# Patient Record
Sex: Female | Born: 1955 | Race: Black or African American | Hispanic: No | Marital: Married
Health system: Southern US, Community
[De-identification: ages and names within clinical notes are randomized; demographics above are authoritative.]

---

## 2014-05-24 ENCOUNTER — Inpatient Hospital Stay: Payer: Self-pay | Admitting: Surgery

## 2014-05-24 LAB — URINALYSIS, COMPLETE
BILIRUBIN, UR: NEGATIVE
Bacteria: NONE SEEN
Blood: NEGATIVE
Glucose,UR: NEGATIVE mg/dL (ref 0–75)
KETONE: NEGATIVE
Leukocyte Esterase: NEGATIVE
NITRITE: NEGATIVE
Ph: 8 (ref 4.5–8.0)
Protein: NEGATIVE
RBC,UR: 1 /HPF (ref 0–5)
Specific Gravity: 1.023 (ref 1.003–1.030)
Squamous Epithelial: NONE SEEN
WBC UR: 9 /HPF (ref 0–5)

## 2014-05-24 LAB — CBC WITH DIFFERENTIAL/PLATELET
Basophil #: 0.1 10*3/uL (ref 0.0–0.1)
Basophil %: 0.6 %
EOS PCT: 0.5 %
Eosinophil #: 0.1 10*3/uL (ref 0.0–0.7)
HCT: 47.3 % — AB (ref 35.0–47.0)
HGB: 15.6 g/dL (ref 12.0–16.0)
LYMPHS ABS: 2.3 10*3/uL (ref 1.0–3.6)
LYMPHS PCT: 18.1 %
MCH: 31.1 pg (ref 26.0–34.0)
MCHC: 33.1 g/dL (ref 32.0–36.0)
MCV: 94 fL (ref 80–100)
MONO ABS: 0.9 x10 3/mm (ref 0.2–0.9)
Monocyte %: 7.1 %
Neutrophil #: 9.5 10*3/uL — ABNORMAL HIGH (ref 1.4–6.5)
Neutrophil %: 73.7 %
Platelet: 269 10*3/uL (ref 150–440)
RBC: 5.04 10*6/uL (ref 3.80–5.20)
RDW: 12.8 % (ref 11.5–14.5)
WBC: 12.9 10*3/uL — ABNORMAL HIGH (ref 3.6–11.0)

## 2014-05-24 LAB — COMPREHENSIVE METABOLIC PANEL
ALK PHOS: 106 U/L
ANION GAP: 8 (ref 7–16)
AST: 143 U/L — AB (ref 15–37)
Albumin: 4.3 g/dL (ref 3.4–5.0)
BUN: 21 mg/dL — ABNORMAL HIGH (ref 7–18)
Bilirubin,Total: 1.1 mg/dL — ABNORMAL HIGH (ref 0.2–1.0)
CHLORIDE: 104 mmol/L (ref 98–107)
CO2: 29 mmol/L (ref 21–32)
CREATININE: 0.8 mg/dL (ref 0.60–1.30)
Calcium, Total: 10 mg/dL (ref 8.5–10.1)
Glucose: 122 mg/dL — ABNORMAL HIGH (ref 65–99)
Osmolality: 286 (ref 275–301)
POTASSIUM: 3.5 mmol/L (ref 3.5–5.1)
SGPT (ALT): 106 U/L — ABNORMAL HIGH (ref 12–78)
Sodium: 141 mmol/L (ref 136–145)
Total Protein: 8 g/dL (ref 6.4–8.2)

## 2014-05-24 LAB — LIPASE, BLOOD: Lipase: 273 U/L (ref 73–393)

## 2014-05-25 LAB — COMPREHENSIVE METABOLIC PANEL
ANION GAP: 6 — AB (ref 7–16)
Albumin: 3.5 g/dL (ref 3.4–5.0)
Alkaline Phosphatase: 145 U/L — ABNORMAL HIGH
BILIRUBIN TOTAL: 1.1 mg/dL — AB (ref 0.2–1.0)
BUN: 11 mg/dL (ref 7–18)
CALCIUM: 8.9 mg/dL (ref 8.5–10.1)
Chloride: 109 mmol/L — ABNORMAL HIGH (ref 98–107)
Co2: 27 mmol/L (ref 21–32)
Creatinine: 0.94 mg/dL (ref 0.60–1.30)
EGFR (African American): >60
EGFR (Non-African Amer.): >60
GLUCOSE: 92 mg/dL (ref 65–99)
Osmolality: 282 (ref 275–301)
Potassium: 3.9 mmol/L (ref 3.5–5.1)
SGOT(AST): 510 U/L — ABNORMAL HIGH (ref 15–37)
SGPT (ALT): 587 U/L — ABNORMAL HIGH (ref 12–78)
SODIUM: 142 mmol/L (ref 136–145)
Total Protein: 6.8 g/dL (ref 6.4–8.2)

## 2014-05-25 LAB — CBC WITH DIFFERENTIAL/PLATELET
BASOS ABS: 0 10*3/uL (ref 0.0–0.1)
Basophil %: 0.5 %
EOS ABS: 0.1 10*3/uL (ref 0.0–0.7)
EOS PCT: 0.9 %
HCT: 41.5 % (ref 35.0–47.0)
HGB: 13.8 g/dL (ref 12.0–16.0)
LYMPHS ABS: 1.5 10*3/uL (ref 1.0–3.6)
Lymphocyte %: 19.8 %
MCH: 31.3 pg (ref 26.0–34.0)
MCHC: 33.2 g/dL (ref 32.0–36.0)
MCV: 94 fL (ref 80–100)
Monocyte #: 0.7 x10 3/mm (ref 0.2–0.9)
Monocyte %: 9.4 %
Neutrophil #: 5.2 10*3/uL (ref 1.4–6.5)
Neutrophil %: 69.4 %
Platelet: 263 10*3/uL (ref 150–440)
RBC: 4.41 10*6/uL (ref 3.80–5.20)
RDW: 12.8 % (ref 11.5–14.5)
WBC: 7.5 10*3/uL (ref 3.6–11.0)

## 2014-05-25 LAB — SEDIMENTATION RATE: Erythrocyte Sed Rate: 9 mm/hr (ref 0–30)

## 2014-05-26 LAB — COMPREHENSIVE METABOLIC PANEL
ALK PHOS: 116 U/L
ALT: 316 U/L — AB (ref 12–78)
ANION GAP: 3 — AB (ref 7–16)
Albumin: 3.1 g/dL — ABNORMAL LOW (ref 3.4–5.0)
BUN: 14 mg/dL (ref 7–18)
Bilirubin,Total: 0.5 mg/dL (ref 0.2–1.0)
CREATININE: 1.05 mg/dL (ref 0.60–1.30)
Calcium, Total: 8.7 mg/dL (ref 8.5–10.1)
Chloride: 109 mmol/L — ABNORMAL HIGH (ref 98–107)
Co2: 29 mmol/L (ref 21–32)
EGFR (African American): >60
GFR CALC NON AF AMER: 58 — AB
Glucose: 84 mg/dL (ref 65–99)
Osmolality: 281 (ref 275–301)
Potassium: 3.8 mmol/L (ref 3.5–5.1)
SGOT(AST): 127 U/L — ABNORMAL HIGH (ref 15–37)
Sodium: 141 mmol/L (ref 136–145)
Total Protein: 6 g/dL — ABNORMAL LOW (ref 6.4–8.2)

## 2014-05-26 LAB — LIPASE, BLOOD: Lipase: 206 U/L (ref 73–393)

## 2014-05-27 LAB — COMPREHENSIVE METABOLIC PANEL
ALT: 216 U/L — AB (ref 12–78)
AST: 54 U/L — AB (ref 15–37)
Albumin: 3.1 g/dL — ABNORMAL LOW (ref 3.4–5.0)
Alkaline Phosphatase: 111 U/L
Anion Gap: 5 — ABNORMAL LOW (ref 7–16)
BILIRUBIN TOTAL: 0.4 mg/dL (ref 0.2–1.0)
BUN: 14 mg/dL (ref 7–18)
CREATININE: 0.98 mg/dL (ref 0.60–1.30)
Calcium, Total: 8.9 mg/dL (ref 8.5–10.1)
Chloride: 110 mmol/L — ABNORMAL HIGH (ref 98–107)
Co2: 28 mmol/L (ref 21–32)
EGFR (Non-African Amer.): >60
Glucose: 108 mg/dL — ABNORMAL HIGH (ref 65–99)
OSMOLALITY: 286 (ref 275–301)
Potassium: 3.7 mmol/L (ref 3.5–5.1)
Sodium: 143 mmol/L (ref 136–145)
Total Protein: 6.1 g/dL — ABNORMAL LOW (ref 6.4–8.2)

## 2014-05-28 LAB — COMPREHENSIVE METABOLIC PANEL
Albumin: 2.9 g/dL — ABNORMAL LOW (ref 3.4–5.0)
Alkaline Phosphatase: 154 U/L — ABNORMAL HIGH
Anion Gap: 6 — ABNORMAL LOW (ref 7–16)
BUN: 9 mg/dL (ref 7–18)
Bilirubin,Total: 0.6 mg/dL (ref 0.2–1.0)
CREATININE: 1.05 mg/dL (ref 0.60–1.30)
Calcium, Total: 8.4 mg/dL — ABNORMAL LOW (ref 8.5–10.1)
Chloride: 108 mmol/L — ABNORMAL HIGH (ref 98–107)
Co2: 28 mmol/L (ref 21–32)
EGFR (African American): >60
GFR CALC NON AF AMER: 58 — AB
GLUCOSE: 110 mg/dL — AB (ref 65–99)
Osmolality: 282 (ref 275–301)
Potassium: 3.4 mmol/L — ABNORMAL LOW (ref 3.5–5.1)
SGOT(AST): 153 U/L — ABNORMAL HIGH (ref 15–37)
SGPT (ALT): 312 U/L — ABNORMAL HIGH (ref 12–78)
SODIUM: 142 mmol/L (ref 136–145)
TOTAL PROTEIN: 6.1 g/dL — AB (ref 6.4–8.2)

## 2014-05-28 LAB — PLATELET COUNT: PLATELETS: 234 10*3/uL (ref 150–440)

## 2014-07-02 LAB — PATHOLOGY REPORT

## 2015-04-16 NOTE — Consult Note (Signed)
Pt with CT this evening showing thickening and edema of the gall bladder wall, likely cholecystitis with adjacent inflammation of liver?  Will get HIDA scan in morning.  Electronic Signatures: Scot JunElliott, Robert T (MD)  (Signed on 02-Jun-15 19:14)  Authored  Last Updated: 02-Jun-15 19:14 by Scot JunElliott, Robert T (MD)

## 2015-04-16 NOTE — Consult Note (Signed)
Brief Consult Note: Diagnosis: RUQ pain gallstones, elevated lft's.   Patient was seen by consultant.   Consult note dictated.   Comments: Obtain CT of abd/pelvis. Inflammatory markers. Await ANA, Hep viral labs. Patient reports hx of receiving Twin Rx.  Unusual to have a blocked bile duct with only a slightly elevated total  bili and such high LFT's. New med Metformin: NO signif liver SE with this drug, in fact safe to use w cirrhosis. Obtain old records from gi w/[facility]. Further gi rec pending findings..  Electronic Signatures: Rowan BlaseMills, Cem Kosman Ann (NP)  (Signed 02-Jun-15 15:19)  Authored: Brief Consult Note   Last Updated: 02-Jun-15 15:19 by Rowan BlaseMills, Rosario Duey Ann (NP)

## 2015-04-16 NOTE — Op Note (Signed)
PATIENT NAME:  Brandi Salinas, [name]:  [id number] DATE OF BIRTH:  11/08/1956  DATE OF PROCEDURE:  05/27/2014  PREOPERATIVE DIAGNOSIS: Symptomatic cholelithiasis, possible choledocholithiasis.   POSTOPERATIVE DIAGNOSIS:  Cholelithiasis and choledocholithiasis.   PROCEDURE PERFORMED:   1.  Laparoscopic cholecystectomy with cholangiogram.  2.  Laparoscopic liver biopsy.   ESTIMATED BLOOD LOSS: 20 mL.   COMPLICATIONS: None.   SPECIMENS: Gallbladder and liver biopsy.   INTRAOPERATIVE FINDINGS: Cholangiogram which showed no contrast into duodenum.   INDICATION FOR SURGERY: [name] is a pleasant 59 year old female who presented initially with signs concerning for cholecystitis; however, she later developed hepatitis-type picture which resolved because of history of previous pain with intermittent right upper quadrant pain. She was brought to the operating room for laparoscopic cholecystectomy.   DETAILS OF PROCEDURE:  As follows:  Informed consent was obtained. [name] was brought to the operating room suite. She was induced. Endotracheal tube was placed. General anesthesia was administered. Her abdomen was prepped and draped in standard surgical fashion. A timeout was then performed correctly identifying the patient name, operative site and procedure to be performed. A supraumbilical incision was made. It was deepened down to the fascia. The fascia was incised. The peritoneum was entered. Two stay sutures were placed through the fasciotomy. An 11 mm epigastric and 2 right subcostal trocars were placed at the midclavicular and anterior axillary line. The gallbladder was then retracted over the dome of the liver. The cystic artery and cystic duct were dissected out. A critical view was obtained. The duct was clipped once and opened. A cholangiogram was then performed, which showed good filling of the biliary tree, but no flow into the duodenum. I did give 1 mg of glucagon and then proceeded to power  flush with normal saline; however, I could not get contrast to go to the duodenum on a repeat cholangiogram. The decision was made to have patient undergo postoperative ERCP. The laparoscopic stapler was placed across the duct in anticipation of postop ERCP. The artery was then clipped and ligated. The gallbladder was then taken off the gallbladder fossa and brought out through an Endo Catch bag. Hemostasis was obtained.  Next, I used laparoscopic uterine scissors to obtain a small specimen for pathology, as the patient did develop brief episode of hepatitis while in the hospital. A 10 JamaicaFrench JP was then placed through the lattermost port to protect against biliary leak after ERCP and sutured in place with a 3-0 nylon. The gallbladder fossa was then examined again and made hemostatic. Following this, the abdomen was desufflated. The trocars were removed under direct visualization. The supraumbilical fascia was closed with figure-of-eight 0 Vicryl. All skin sites were then closed with interrupted 4-0 Monocryl deep dermals. Dermabond was then placed over the wound, and the patient was then awoken, extubated and brought to the postanesthesia care unit. There were no immediate complications. Needle, sponge and instrument counts were correct at the end of the procedure.      ____________________________ Si Raiderhristopher A. Pam Vanalstine, MD cal:dmm D: 05/28/2014 09:19:02 ET T: 05/28/2014 09:40:03 ET JOB#: [id number]  cc: Cristal Deerhristopher A. Amalia Edgecombe, MD, <Dictator> Jarvis NewcomerHRISTOPHER A Chandler Stofer MD ELECTRONICALLY SIGNED 05/29/2014 13:30

## 2015-04-16 NOTE — Consult Note (Signed)
Chief Complaint:  Subjective/Chief Complaint Dr Rexene Edison contacted me to set up ERCP with Dr Allen Norris tomorrow.  Brandi Salinas has mild abdominal incisional pain currently 2/10.  Denies nausea or vomiting.   VITAL SIGNS/ANCILLARY NOTES: **Vital Signs.:   04-Jun-15 11:04  Vital Signs Type Post-Op  Temperature Temperature (F) 97.8  Celsius 36.5  Pulse Pulse 60  Respirations Respirations 16  Systolic BP Systolic BP 712  Diastolic BP (mmHg) Diastolic BP (mmHg) 68  Mean BP 82  Pulse Ox % Pulse Ox % 95  Pulse Ox Activity Level  At rest  Oxygen Delivery Room Air/ 21 %   Brief Assessment:  GEN well developed, well nourished, no acute distress, A/Ox3, but sleepy   Respiratory normal resp effort   Gastrointestinal details normal Soft  Nondistended  +faint BSx4, JP with scant serous drainage   EXTR negative cyanosis/clubbing, Trace pretibial edema bilat   Additional Physical Exam Skin: warm, dry, intact   Lab Results:  Hepatic:  04-Jun-15 04:18   Bilirubin, Total 0.4  Alkaline Phosphatase 111 (45-117 NOTE: New Reference Range 11/13/13)  SGPT (ALT)  216  SGOT (AST)  54  Total Protein, Serum  6.1  Albumin, Serum  3.1  Routine Chem:  04-Jun-15 04:18   Glucose, Serum  108  BUN 14  Creatinine (comp) 0.98  Sodium, Serum 143  Potassium, Serum 3.7  Chloride, Serum  110  CO2, Serum 28  Calcium (Total), Serum 8.9  Osmolality (calc) 286  eGFR (African American) >60  eGFR (Non-African American) >60 (eGFR values <5m/min/1.73 m2 may be an indication of chronic kidney disease (CKD). Calculated eGFR is useful in patients with stable renal function. The eGFR calculation will not be reliable in acutely ill patients when serum creatinine is changing rapidly. It is not useful in  patients on dialysis. The eGFR calculation may not be applicable to patients at the low and high extremes of body sizes, pregnant women, and vegetarians.)  Anion Gap  5   Radiology Results: XRay:    04-Jun-15 08:37,  Cholangiogram Operative  Cholangiogram Operative   REASON FOR EXAM:    Cholelithiasis  COMMENTS:       PROCEDURE: DXR - DXR CHOLANGIOGRAM OP (INITIAL)  - May 27 2014  8:37AM     CLINICAL DATA:  Cholecystectomy for symptomatic cholelithiasis.    EXAM:  INTRAOPERATIVE CHOLANGIOGRAM    TECHNIQUE:  Cholangiographic images from the C-arm fluoroscopic device were  submitted for interpretation post-operatively. Please see the  procedural report for the amount of contrast and the fluoroscopy  time utilized.  COMPARISON:  Abdominal ultrasound on 05/24/2014, CT of the abdomen  on 05/25/2014.    FINDINGS:  Intraoperative image shows obtained with a C-arm demonstrates a  normal caliber opacified biliary tree. There are no visualized  filling defects. On the submitted images, contrast is not identified  in the duodenum and the distal common bile duct is not completely  evaluated. Recommend correlation with real-time appearance  intraoperatively at the time of contrast injection.     IMPRESSION:  Nonvisualization of the duodenum.    Electronically Signed    By: GAletta EdouardM.D.    On: 05/27/2014 08:42         Verified By: GAzzie Roup M.D.,   Assessment/Plan:  Assessment/Plan:  Assessment Choledocholithiasis on IOC:  Reviewed records from surgery & Dr EVira Agar  Explained ERCP with sphincterotomy/stone extraction with Dr WAllen Norristomorrow to patient & sister.  Discussed risks/benefits of procedure which include but are not limited  to pancreatitis, bleeding, infection, perforation & drug reaction.  Patient agrees with this plan & consent will be obtained.   Plan 1) NPO after MN 2) ERCP tomorrow 3) continue supportive measures 4) Brandi Salinas already on Invanz for antibiotic coverage Please call if you have any questions or concerns   Electronic Signatures: Andria Meuse (NP)  (Signed 04-Jun-15 13:45)  Authored: Chief Complaint, VITAL SIGNS/ANCILLARY NOTES, Brief Assessment, Lab  Results, Radiology Results, Assessment/Plan   Last Updated: 04-Jun-15 13:45 by Andria Meuse (NP)

## 2015-04-16 NOTE — Consult Note (Signed)
Pt feeling better, her LFT's are down significantly, HIDA scan shows no evidence of obst.  Agree with plans for GB removal tomorrow.  Patient in good spirits, would like a copy of discharge summary to go to her primary doctor.  CRP up some, Hep ABC shows only old Hep A which is very common.   Electronic Signatures: Scot JunElliott, Odus Clasby T (MD)  (Signed on 03-Jun-15 14:39)  Authored  Last Updated: 03-Jun-15 14:39 by Scot JunElliott, Shatera Rennert T (MD)

## 2015-04-16 NOTE — Consult Note (Signed)
PATIENT NAME:  Brandi Salinas, Brandi Salinas MR#:  [id number] DATE OF BIRTH:  [birth year]  DATE OF CONSULTATION:  05/25/2014  REFERRING PHYSICIAN:  Cristal Deer A. Lundquist, MD CONSULTING PHYSICIAN:  Scot Jun, MD/Artur Winningham A. Arvilla Market, ANP (Adult Nurse Practitioner)  REASON FOR CONSULTATION: Elevated liver enzymes.   HISTORY OF PRESENT ILLNESS: This 59 year old patient has a history of recently diagnosed diabetes mellitus with new-start metformin and allergic rhinitis with as-needed Claritin, presents to the Emergency Room with severe epigastric, right upper quadrant pain of 1-day duration. The patient reports on Sunday she had abdominal pain associated after eating egg white, grapes and oatmeal fiber bar, which is her basic breakfast. She was on her way to work, developed severe pain associated with nausea, had a couple episodes of vomiting. This pain became quite severe in the epigastric right upper quadrant area, radiating into the back. She presented to the Emergency Room. Ultrasound showed gallstones and elevated liver enzymes. GI has been asked to see patient and her. She has been seen by Dr. Juliann Pulse from surgery and decision is to be made whether her liver findings are related to the gallbladder abnormality or something new.   The patient reports she has had problems with epigastric and right upper quadrant pain intermittent now for several years. She did have an upper endoscopy by Dr. Huston Foley at [facility] 09/2013 for this complaint with reported  normal findings. She reports she either had a barium swallow or a gastric emptying study. She was having problems with vomiting fully digested food at that time. The patient does not present on proton pump inhibitor. She denies history of gastritis, ulcers. With eating, she would often get sensation in the low midsternal area of pressure, some discomfort, no true food dysphagia. She denies history of ulcer. She is also up-to-date on colonoscopy for  personal history of colon polyps with her most recent study reported about 2 years ago. The patient reports she had an ultrasound last year as well as a CT and denies hearing about gallstones or abnormal liver studies. To her knowledge, she has never been diagnosed with elevated liver labs. She may have heard the term fatty liver, she is not certain. No risk factors or history of hepatitis. The patient did have a Twinrix vaccine about a year and a half ago in preparation for a trip to [place].   Currently, the patient is receiving narcotic pain medication, has remained n.p.o. and is feeling much better. She tried to eat a few crackers yesterday with severe nausea. The patient is feeling hungry and is requesting food when possible. Her LFTs markedly incresed overnight with stable total bilirubin.   PAST MEDICAL HISTORY: 1.  Diabetes mellitus.  2.  Allergic rhinitis.   PAST SURGICAL HISTORY: None listed.   MEDICATIONS:  1.  Metformin 500 mg daily.  2.  Claritin 24-hour tablet once daily.   HABITS: Negative tobacco or alcohol, past or present.   SOCIAL HISTORY: The patient is married, works in Community education officer. She denies history of illicit drug use or risk factors for hepatitis.   REVIEW OF SYSTEMS: Ten systems reviewed. Positive as noted in the history of present illness, otherwise negative. She feels cold all the time but has noted no fevers. Does have some chills. Abdominal pain as noted. Reports bowel habits are normal without blood or melena. She denies exertional chest pain, shortness of breath or dyspnea on exertion. No exertional epigastric pain. The patient denies NSAID use.   PHYSICAL EXAMINATION: VITAL SIGNS: Temperature 98.1,  75, 18, pressure 120/78, pulse oximetry room air is 96%.  GENERAL: Well-appearing, well-nourished female in no acute distress.  HEENT: Head is normocephalic. Conjunctivae pink. Sclerae anicteric. Oral mucosa is moist and intact.  NECK: Supple. Trachea is midline. No  lymphadenopathy.  CARDIAC: S1, S2 without murmur or gallop.  LUNGS: CTA. Respirations are eupneic.  ABDOMEN: Soft, positive bowel sounds, mild epigastric tenderness, more tenderness right upper quadrant. Positive Murphy's sign. Slight right mid to lower discomfort with palpation.  RECTAL: Deferred.  SKIN: Warm and dry without rash or edema.  NEUROLOGIC: Cranial nerves II through XII grossly intact. Good memory.  PSYCHIATRIC: Affect and mood within normal. Appears comfortable and relaxed.   LABORATORY, DIAGNOSTIC, AND RADIOLOGICAL DATA:  1.  Admission laboratory studies 05/24/2014, BUN 21, creatinine 0.80, glucose 122, lipase 273, albumin is 4.3, total bilirubin 1.1, alkaline phosphatase 106, AST 143, ALT 106. WBC 12.9, hemoglobin 15.6, platelets 269. EKG shows sinus bradycardia, heart rate 46, no ST abnormality, no acute changes.  2.  Followup laboratory studies 05/25/2014 with total bilirubin 1.1, alkaline phosphatase 145, AST 510, ALT 587. WBC 7.5, hemoglobin 13.8.  3.  Radiology: Abdominal ultrasound performed 06/091/2015 for right upper quadrant pain, and this was a limited right upper quadrant ultrasound showing gallbladder with multiple mobile gallstones. No wall thickening or sonographic Murphy's sign noted. Common bile duct 3.2 mm. Liver showed no focal lesion, normal parenchyma.   IMPRESSION: This 59 year old patient with newly diagnosed diabetes and history of recurrent epigastric, right upper quadrant pain status post reported negative esophagogastroduodenoscopy  last year comes in with ultrasound showing gallstones, elevated liver LFTs, slight elevation in alkaline phosphatase and unremarkable total bilirubin. Laboratory studies have been consistent with cholecystitis. The patient is receiving IV antibiotics. She has had consultation with a Careers advisersurgeon. There is discussion about potential cholecystectomy after other possible etiologies have been worked up.   PLAN:  1.  Agree with ANA,  hepatitis viral panel, will await results. The patient reports she has had the Twinrix hepatitis A and B vaccine about a year and a half ago in anticipation for a trip to [place]. She denies risk factors for elevated liver labs. Up-to-date reviewed and metformin does not have a particular side effect profile for any liver abnormality. In fact, it is safe to use metformin with cirrhosis. Do not suspect drug interaction.  2.  Agree with plans for CT of the abdomen and pelvis per Dr. Earnest ConroyElliott's recommendation. Also will add sedimentation rate and CRP. Further GI recommendations pending study results.   Thank you for the consultation. These services provided by Amedeo KinsmanKimberly Lonnette Shrode, ANP, under collaborative agreement with Scot Junobert T. Elliott, MD.   ____________________________ Ranae PlumberKimberly A. Arvilla MarketMills, ANP (Adult Nurse Practitioner) kam:cs D: 05/25/2014 15:13:57 ET T: 05/25/2014 15:42:41 ET JOB#: [id number]  cc: Cala BradfordKimberly A. Arvilla MarketMills, ANP (Adult Nurse Practitioner), <Dictator> Ranae PlumberKimberly A. Suzette BattiestMills RN, MSN, ANP-BC Adult Nurse Practitioner ELECTRONICALLY SIGNED 05/25/2014 18:56

## 2015-04-16 NOTE — Discharge Summary (Signed)
PATIENT NAME:  Brandi Salinas, Brandi Salinas MR#:  [id number] DATE OF BIRTH:  10/20/1956  DATE OF ADMISSION:  05/24/2014 DATE OF DISCHARGE:  05/29/2014  DISCHARGE DIAGNOSES: 1.  Choledocholithiasis.  2.  Diabetes mellitus.  3.  History of Cesarean section.   PROCEDURE PERFORMED:  1.  Laparoscopic cholecystectomy with cholangiogram and laparoscopic liver biopsy 05/27/2014.  2.  ERCP with common duct stone extraction on 05/28/2014.    DISCHARGE MEDICATIONS:  Are as follows:   1.  Metformin 500 mg by mouth daily.  2.  Claritin 24 hour allergy 10 mg by mouth daily as needed.  3.  Norco 1 tab by mouth q. 4 hours as needed pain.   INDICATION FOR ADMISSION:  Brandi Salinas is a pleasant 59 year old female who presented with right upper quadrant epigastric pain, nausea and vomiting.  She has had recurrent pain in the past and had a mildly elevated white cell count and LFTs and was admitted for what was thought to be management of cholecystitis.   HOSPITAL COURSE:  Brandi Salinas was admitted with what was felt to be cholecystitis; however, on hospital day 2, her LFTs had increased greatly.  GI was counseled at that time and was thought that she may have had some sort of hepatitic event, however choledocholithiasis was still in the consideration.  These had improved and on hospital day 4, she underwent laparoscopic cholecystectomy with cholangiogram and liver biopsy to evaluate for possible hepatitis.  She was noted to have a common duct stone and contrast did not flow easily into the duodenum.  Therefore, on June 5th, she underwent ERCP with Dr. Servando SnareWohl and had a common duct stone extraction.  On June 6th she was given a regular diet, was taking good by mouth with good by mouth pain control, was voiding and stooling without difficulties.  At that time, she was discharged to home in satisfactory condition.   DISCHARGE INSTRUCTIONS:  Brandi Salinas is to follow up with me in approximately one week.  She is to call or return to the ED if  has increased pain, nausea, vomiting, redness, drainage from incision.     ____________________________ Si Raiderhristopher A. Lundquist, MD cal:ea D: 06/10/2014 21:22:48 ET T: 06/11/2014 06:04:19 ET JOB#: [id number]  cc: Cristal Deerhristopher A. Lundquist, MD, <Dictator> Jarvis NewcomerHRISTOPHER A LUNDQUIST MD ELECTRONICALLY SIGNED 06/15/2014 10:47

## 2015-04-16 NOTE — H&P (Signed)
   Subjective/Chief Complaint RUQ pain, acute onset and recurrent   History of Present Illness [name] is a pleasant 59 yo F with a history of recently diagnosed DM and prior workup for RUQ/epigastric pain who presents with 1 day of worsening RUQ pain and N/V.  She said that her pain began yesterday and improved then became more severe at 9 am.  + N/V, ? bloody.  Has had recurrent episodes of a similar pain before and has had EGD with negative workup except hiatal hernia.  + chills prior to vomiting.  Gets regular colonoscopies for FH of colon cancer   Past History DM H/o c section   Past Medical Health Diabetes Mellitus   Past Med/Surgical Hx:  Diabetes:   ALLERGIES:  No Known Allergies:   Family and Social History:  Family History Coronary Artery Disease  Hypertension  Diabetes Mellitus  Cancer  Colon cancer   Social History negative tobacco, negative ETOH   Place of Living Home   Review of Systems:  Subjective/Chief Complaint RUQ pain   Fever/Chills Yes   Cough No   Sputum No   Abdominal Pain Yes   Diarrhea No   Constipation No   Nausea/Vomiting Yes   SOB/DOE No   Chest Pain No   Dysuria No   Tolerating PT No   Tolerating Diet Nauseated  Vomiting   Medications/Allergies Reviewed Medications/Allergies reviewed   Physical Exam:  GEN well developed, well nourished, no acute distress   HEENT pink conjunctivae, PERRL   RESP normal resp effort  clear BS  no use of accessory muscles   CARD regular rate  no murmur  no thrills   ABD positive tenderness  no hernia  soft  normal BS  RUQ tender to palpation   EXTR negative cyanosis/clubbing, negative edema   SKIN normal to palpation, No rashes, No ulcers, skin turgor poor   NEURO cranial nerves intact, negative rigidity, negative tremor   PSYCH alert, A+O to time, place, person, good insight    Assessment/Admission Diagnosis [name] is a pleasant 59 yo diabetic female with a PMH of recurrent RUQ  pain.  U/S with gallstones, no gb wall thickening and no pericholecystic fluid.  + leukocytosis, + mildly elevated LFT.  Tender to palp.  Clinical and laboratory findings consistent with cholecystitis.   Plan Resuscitate, IV abx.  Plan for lap cholecystectomy tomorrow.   Electronic Signatures: Jarvis NewcomerLundquist, Dera Vanaken A (MD)  (Signed 01-Jun-15 15:59)  Authored: CHIEF COMPLAINT and HISTORY, PAST MEDICAL/SURGIAL HISTORY, ALLERGIES, FAMILY AND SOCIAL HISTORY, REVIEW OF SYSTEMS, PHYSICAL EXAM, ASSESSMENT AND PLAN   Last Updated: 01-Jun-15 15:59 by Jarvis NewcomerLundquist, Laveta Gilkey A (MD)

## 2015-07-30 IMAGING — CT CT ABD-PELV W/ CM
2 of 5 series · 17 of 46 positions shown, 19 images · IV contrast (isovue)
Comparison: Right upper quadrant sign dated 05/24/2014

CLINICAL DATA: Recurrent right upper quadrant abdominal pain,
elevated LFTs, leukocytosis. Known gallstones. Clinical appearance
favors hepatitis over cholecystitis.

EXAM:
CT ABDOMEN AND PELVIS WITH CONTRAST
TECHNIQUE: Multidetector CT imaging of the abdomen and pelvis was performed
using the standard protocol following bolus administration of
intravenous contrast.
CONTRAST:  100 mL Isovue 370 IV

[Series 2: routine abd pel with · axial · 0.83mm/px · z∈[-1106,-681]mm · 14 of 97 slices shown, 16 images]
[im 6/97  soft-tissue]
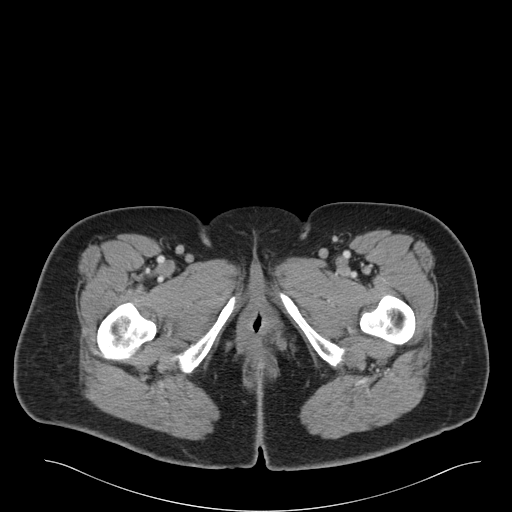
[im 6/97  bone]
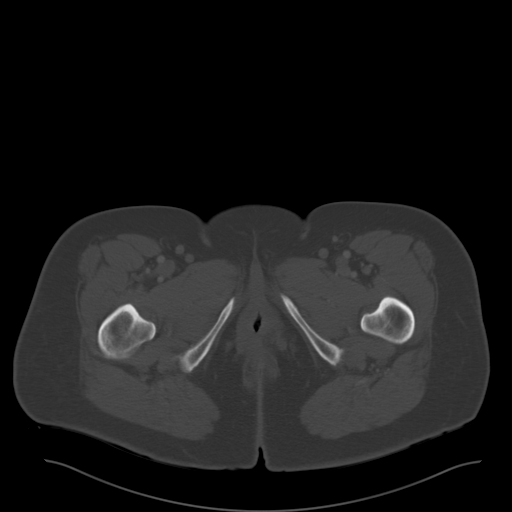
[im 11/97  soft-tissue]
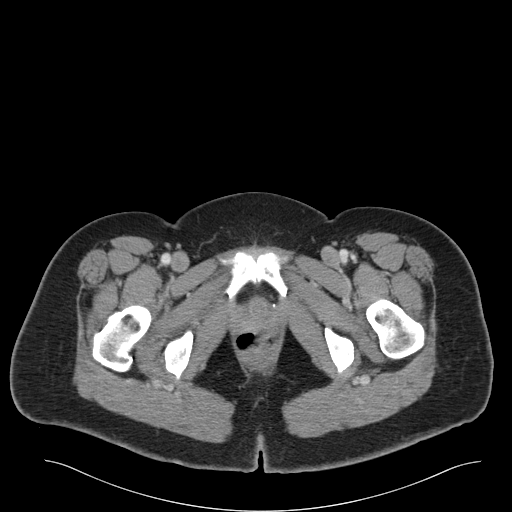
[im 22/97  soft-tissue]
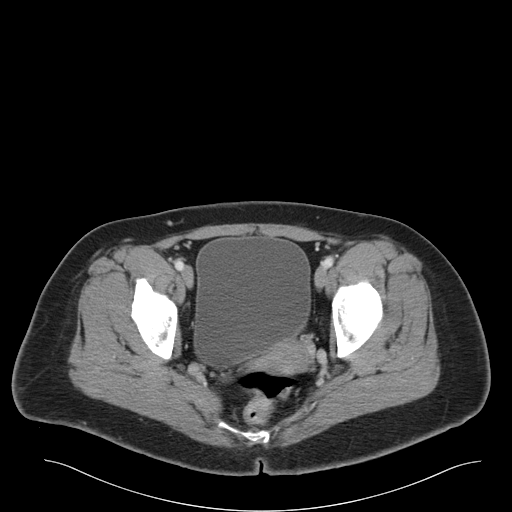
[im 27/97  soft-tissue]
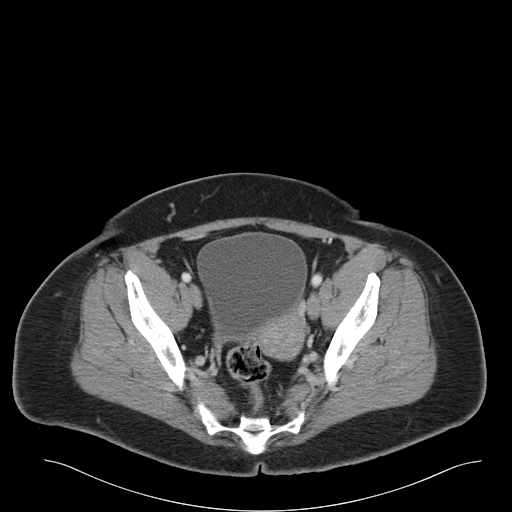
[im 33/97  soft-tissue]
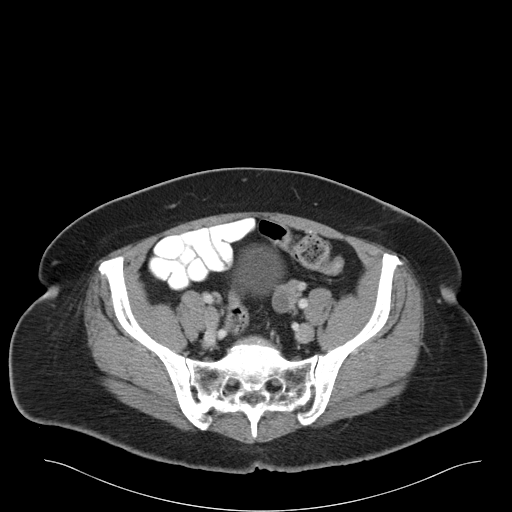
[im 38/97  soft-tissue]
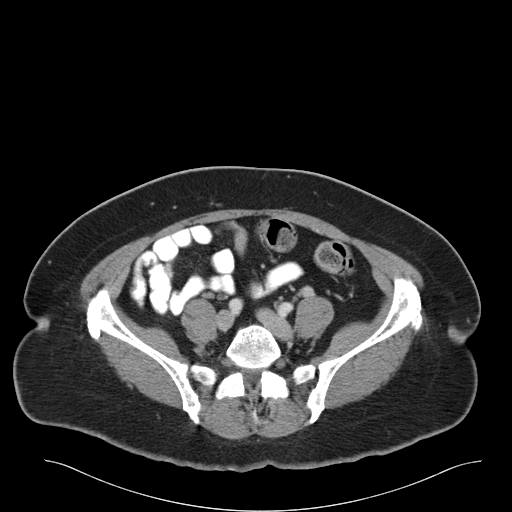
[im 43/97  soft-tissue]
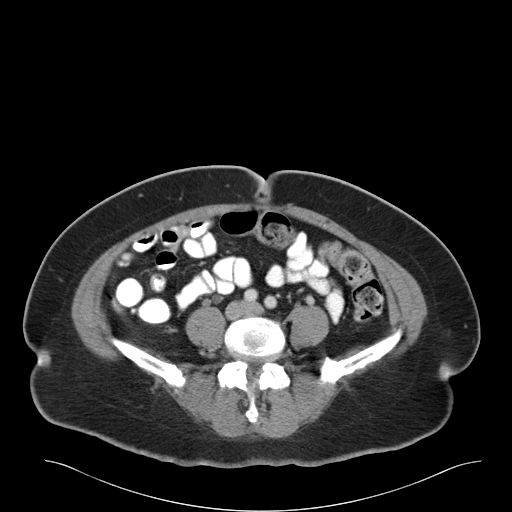
[im 54/97  soft-tissue]
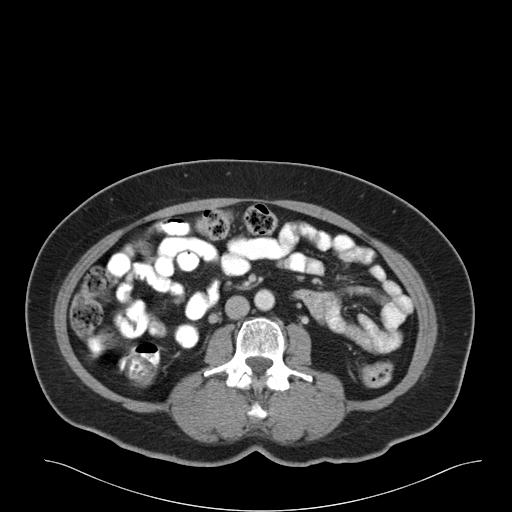
[im 59/97  soft-tissue]
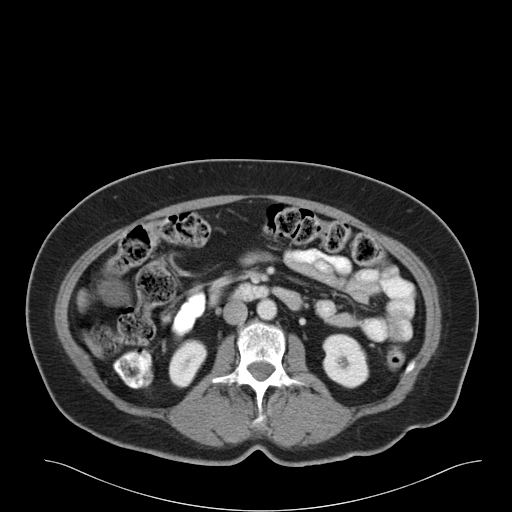
[im 59/97  bone]
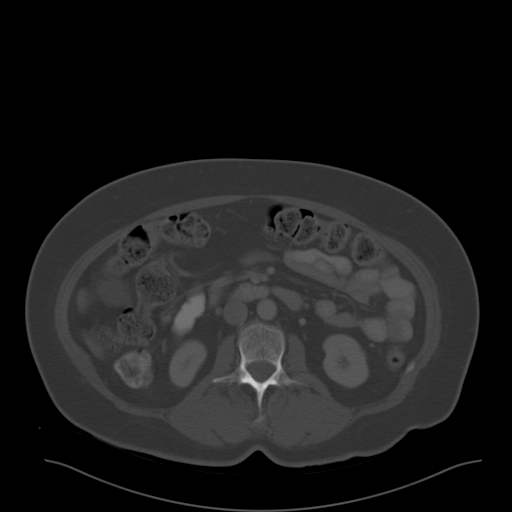
[im 65/97  soft-tissue]
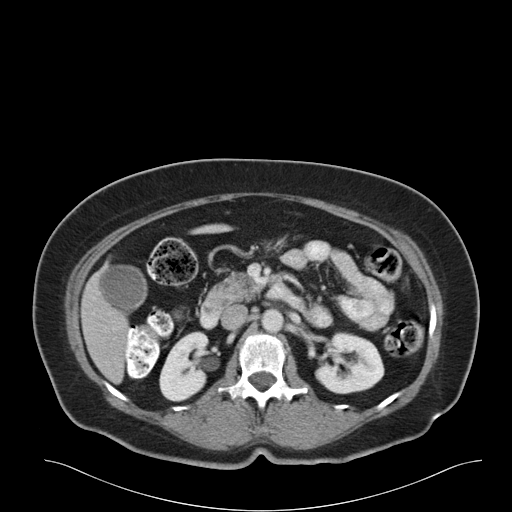
[im 70/97  soft-tissue]
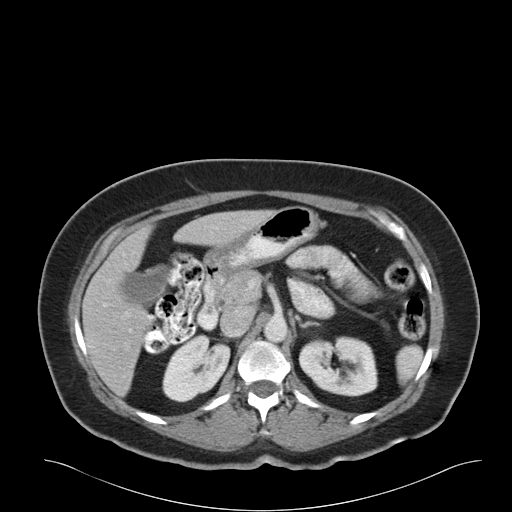
[im 75/97  soft-tissue]
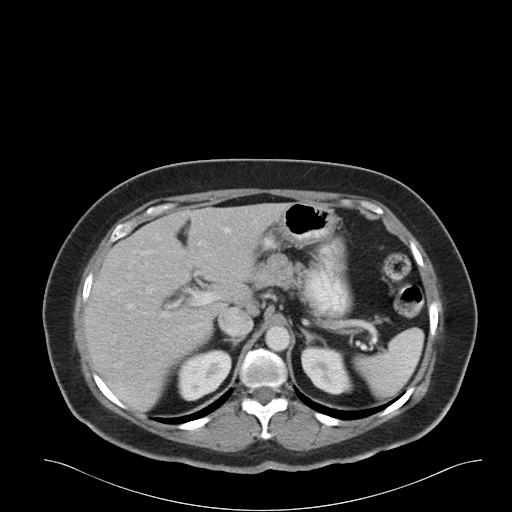
[im 86/97  soft-tissue]
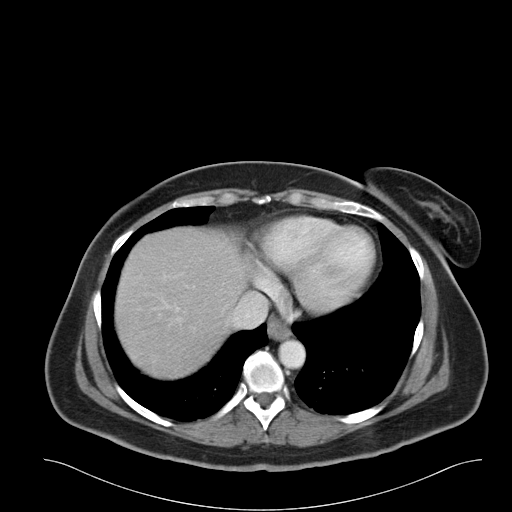
[im 91/97  soft-tissue]
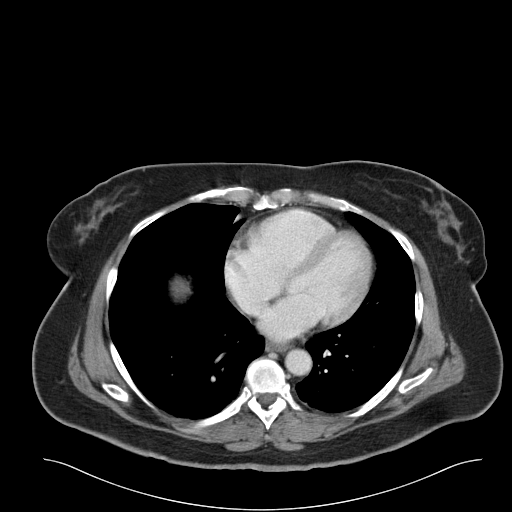

[Series 6: cor routine abd pel with · coronal · 0.94mm/px · 3 of 141 slices shown]
[im 47/141  soft-tissue]
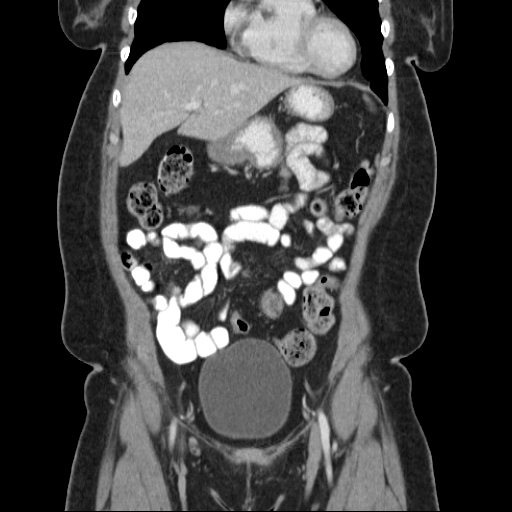
[im 63/141  soft-tissue]
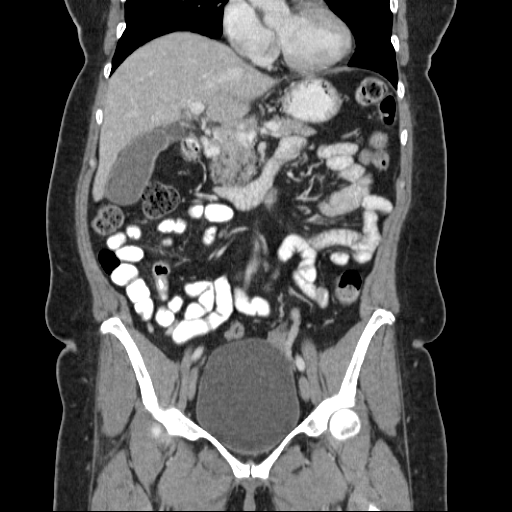
[im 78/141  soft-tissue]
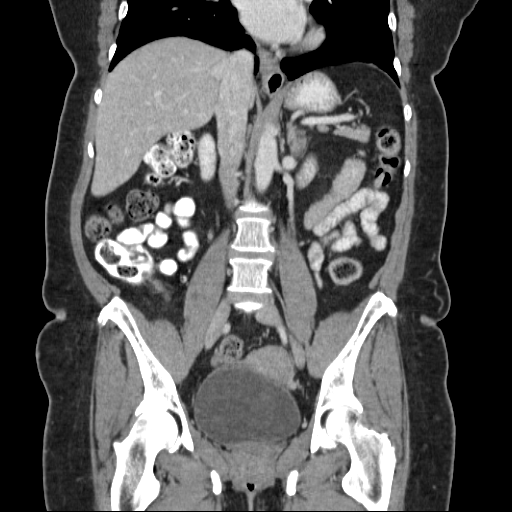

[17 of 46 positions shown; findings below may reference images not displayed]

FINDINGS: Mild dependent atelectasis lung bases.

Small hiatal hernia.

Liver is within normal limits by CT.

Spleen, pancreas, and adrenal glands are within normal limits.

Mild gallbladder wall thickening/edema (series 2/image 33). Known
gallstones are not evident by CT.

Kidneys are within normal limits.  No hydronephrosis.

No evidence of bowel obstruction.  Normal appendix.

No evidence of abdominal aortic aneurysm.

No bone pelvic ascites.

No suspicious abdominopelvic lymphadenopathy.

Uterus and left ovary are within normal limits no right adnexal
mass.

Bladder is within normal limits.

Mild degenerative changes of the lower thoracic spine.
IMPRESSION: Known gallstones are not evident by CT.

Mild gallbladder wall thickening/edema. This appearance would be
compatible with a right upper quadrant inflammatory process such as
hepatitis. If there is continued clinical concern for acute
cholecystitis, consider hepatobiliary nuclear medicine scan.
